# Patient Record
Sex: Male | Born: 1995 | Race: White | Hispanic: No | Marital: Single | Smoking: Never smoker
Health system: Southern US, Community
[De-identification: ages and names within clinical notes are randomized; demographics above are authoritative.]

---
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
## 2018-02-18 ENCOUNTER — Other Ambulatory Visit: Payer: Self-pay

## 2018-02-18 ENCOUNTER — Emergency Department (HOSPITAL_COMMUNITY): Payer: 59

## 2018-02-18 ENCOUNTER — Encounter (HOSPITAL_COMMUNITY): Payer: Self-pay | Admitting: Emergency Medicine

## 2018-02-18 ENCOUNTER — Emergency Department (HOSPITAL_COMMUNITY)
Admission: EM | Admit: 2018-02-18 | Discharge: 2018-02-18 | Disposition: A | Discharge status: Home / Self Care | Payer: 59 | Attending: Emergency Medicine | Admitting: Emergency Medicine

## 2018-02-18 DIAGNOSIS — Z041 Encounter for examination and observation following transport accident: Secondary | ICD-10-CM | POA: Diagnosis not present

## 2018-02-18 MED ORDER — IBUPROFEN 400 MG PO TABS
600.0000 mg | ORAL_TABLET | Freq: Once | ORAL | Status: AC
  Administered 2018-02-18: 600 mg via ORAL
  Filled 2018-02-18: qty 1

## 2018-02-18 MED ORDER — TETANUS-DIPHTHERIA TOXOIDS TD 5-2 LFU IM INJ
0.5000 mL | INJECTION | Freq: Once | INTRAMUSCULAR | Status: AC
  Administered 2018-02-18: 0.5 mL via INTRAMUSCULAR
  Filled 2018-02-18: qty 0.5

## 2018-02-18 NOTE — ED Notes (Signed)
Pt stable, ambulatory, and verbalizes understanding of d/c instructions.  
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
## 2018-02-18 NOTE — Discharge Instructions (Addendum)
Return for any problem.  Follow-up with your regular doctor as instructed.  Use ibuprofen - 600 mg by mouth every 8 hours - as needed for pain.
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
## 2024-06-18 ENCOUNTER — Ambulatory Visit: Admitting: Podiatry

## 2024-07-16 ENCOUNTER — Encounter: Payer: Self-pay | Admitting: Podiatry

## 2024-07-16 ENCOUNTER — Ambulatory Visit (INDEPENDENT_AMBULATORY_CARE_PROVIDER_SITE_OTHER): Payer: Self-pay | Admitting: Podiatry

## 2024-07-16 DIAGNOSIS — L600 Ingrowing nail: Secondary | ICD-10-CM

## 2024-07-16 MED ORDER — CEPHALEXIN 500 MG PO CAPS: 500.0000 mg | ORAL_CAPSULE | Freq: Three times a day (TID) | ORAL | 0 refills | Status: AC

## 2024-07-16 NOTE — Progress Notes (Signed)
  Subjective:   Patient ID: Lynwood KANDICE Claude, male   DOB: 28 y.o.   MRN: [id number]   HPI Chief Complaint  Patient presents with   Ingrown Toenail    Left foot great toenail borders ingrown. 7 pain walking. Non diabetic.    Presenting-year-old male presents the office with above concerns.  He states the ingrown toenails been present for some time his left big toe and this time he like to have the area removed.  No current drainage or purulence that he reports.   Review of Systems  All other systems reviewed and are negative.  History reviewed. No pertinent past medical history.  History reviewed. No pertinent surgical history.   Current Outpatient Medications:    cephALEXin (KEFLEX) 500 MG capsule, Take 1 capsule (500 mg total) by mouth 3 (three) times daily., Disp: 21 capsule, Rfl: 0   testosterone cypionate (DEPOTESTOSTERONE CYPIONATE) 200 MG/ML injection, SMARTSIG:0.3 Milliliter(s) IM Twice a Week, Disp: , Rfl:   No Known Allergies        Objective:  Physical Exam  General: AAO x3, NAD  Dermatological: Incurvated of the toenail left hallux on both medial and lateral aspect of the lateral aspect and symptomatic.  Localized edema and erythema likely more from inflammation as opposed to infection.  There is no drainage or pus there is no ascending cellulitis.  No open lesions.  Vascular: Dorsalis Pedis artery and Posterior Tibial artery pedal pulses are 2/4 bilateral with immedate capillary fill time. There is no pain with calf compression, swelling, warmth, erythema.   Neruologic: Grossly intact via light touch bilateral.   Musculoskeletal: Tenderness on the ingrown toenail left hallux nail borders.       Assessment:   Ingrown toenail left hallux  Plan:  -Treatment options discussed including all alternatives, risks, and complications -Etiology of symptoms were discussed -At this time, the patient is requesting partial nail removal with chemical matricectomy to  the symptomatic portion of the nail. Risks and complications were discussed with the patient for which they understand and written consent was obtained. Under sterile conditions a total of 3 mL of a mixture of 2% lidocaine plain and 0.5% Marcaine plain was infiltrated in a hallux block fashion. Once anesthetized, the skin was prepped in sterile fashion. A tourniquet was then applied. Next the medial and lateral aspect of hallux nail border was then sharply excised making sure to remove the entire offending nail border. Once the nails were ensured to be removed area was debrided and the underlying skin was intact. There is no purulence identified in the procedure. Next phenol was then applied under standard conditions and copiously irrigated. Silvadene was applied. A dry sterile dressing was applied. After application of the dressing the tourniquet was removed and there is found to be an immediate capillary refill time to the digit. The patient tolerated the procedure well any complications. Post procedure instructions were discussed the patient for which he verbally understood. Discussed signs/symptoms of infection and directed to call the office immediately should any occur or go directly to the emergency room. In the meantime, encouraged to call the office with any questions, concerns, changes symptoms. -Keflex  No follow-ups on file.  Donnice JONELLE Fees DPM

## 2024-07-16 NOTE — Patient Instructions (Signed)
